# Patient Record
Sex: Male | Born: 1975 | Race: White | Hispanic: No | Marital: Married
Health system: Southern US, Community
[De-identification: ages and names within clinical notes are randomized; demographics above are authoritative.]

---

## 2010-09-16 ENCOUNTER — Emergency Department: Payer: Self-pay | Admitting: Emergency Medicine

## 2010-09-16 ENCOUNTER — Inpatient Hospital Stay: Payer: Self-pay | Admitting: Psychiatry

## 2010-09-27 ENCOUNTER — Inpatient Hospital Stay: Payer: Self-pay | Admitting: Unknown Physician Specialty

## 2010-12-19 ENCOUNTER — Emergency Department: Payer: Self-pay | Admitting: *Deleted

## 2013-08-15 ENCOUNTER — Ambulatory Visit: Payer: Self-pay

## 2013-08-18 ENCOUNTER — Ambulatory Visit: Payer: Self-pay | Admitting: Family Medicine

## 2013-12-02 ENCOUNTER — Ambulatory Visit: Payer: Self-pay | Admitting: Physician Assistant

## 2014-01-22 ENCOUNTER — Ambulatory Visit: Payer: Self-pay | Admitting: Physician Assistant

## 2014-02-17 ENCOUNTER — Ambulatory Visit: Payer: Self-pay

## 2014-06-20 ENCOUNTER — Ambulatory Visit: Payer: Self-pay | Admitting: Emergency Medicine

## 2014-07-15 ENCOUNTER — Ambulatory Visit: Payer: Self-pay | Admitting: Family Medicine

## 2015-08-30 ENCOUNTER — Emergency Department
Admission: EM | Admit: 2015-08-30 | Discharge: 2015-08-30 | Disposition: A | Discharge status: Home / Self Care | Payer: Self-pay | Attending: Emergency Medicine | Admitting: Emergency Medicine

## 2015-08-30 ENCOUNTER — Emergency Department: Payer: Self-pay

## 2015-08-30 DIAGNOSIS — E1165 Type 2 diabetes mellitus with hyperglycemia: Secondary | ICD-10-CM | POA: Insufficient documentation

## 2015-08-30 DIAGNOSIS — R739 Hyperglycemia, unspecified: Secondary | ICD-10-CM

## 2015-08-30 DIAGNOSIS — I10 Essential (primary) hypertension: Secondary | ICD-10-CM | POA: Insufficient documentation

## 2015-08-30 DIAGNOSIS — J45909 Unspecified asthma, uncomplicated: Secondary | ICD-10-CM | POA: Insufficient documentation

## 2015-08-30 DIAGNOSIS — J209 Acute bronchitis, unspecified: Secondary | ICD-10-CM

## 2015-08-30 LAB — URINALYSIS COMPLETE WITH MICROSCOPIC (ARMC ONLY)
Bacteria, UA: NONE SEEN
Bilirubin Urine: NEGATIVE
Hgb urine dipstick: NEGATIVE
Ketones, ur: NEGATIVE mg/dL
Nitrite: NEGATIVE
PROTEIN: NEGATIVE mg/dL
SPECIFIC GRAVITY, URINE: 1.032 — AB (ref 1.005–1.030)
pH: 5 (ref 5.0–8.0)

## 2015-08-30 LAB — CBC
HEMATOCRIT: 46.8 % (ref 40.0–52.0)
HEMOGLOBIN: 16 g/dL (ref 13.0–18.0)
MCH: 30.4 pg (ref 26.0–34.0)
MCHC: 34.3 g/dL (ref 32.0–36.0)
MCV: 88.7 fL (ref 80.0–100.0)
Platelets: 308 10*3/uL (ref 150–440)
RBC: 5.28 MIL/uL (ref 4.40–5.90)
RDW: 13.6 % (ref 11.5–14.5)
WBC: 13 10*3/uL — ABNORMAL HIGH (ref 3.8–10.6)

## 2015-08-30 LAB — BASIC METABOLIC PANEL
ANION GAP: 8 (ref 5–15)
BUN: 13 mg/dL (ref 6–20)
CHLORIDE: 99 mmol/L — AB (ref 101–111)
CO2: 26 mmol/L (ref 22–32)
Calcium: 9.4 mg/dL (ref 8.9–10.3)
Creatinine, Ser: 1.04 mg/dL (ref 0.61–1.24)
GFR calc non Af Amer: >60 mL/min (ref >60)
GLUCOSE: 435 mg/dL — AB (ref 65–99)
Potassium: 4 mmol/L (ref 3.5–5.1)
Sodium: 133 mmol/L — ABNORMAL LOW (ref 135–145)

## 2015-08-30 LAB — GLUCOSE, CAPILLARY
GLUCOSE-CAPILLARY: 402 mg/dL — AB (ref 65–99)
GLUCOSE-CAPILLARY: 380 mg/dL — AB (ref 65–99)

## 2015-08-30 MED ORDER — SODIUM CHLORIDE 0.9 % IV BOLUS (SEPSIS)
2000.0000 mL | Freq: Once | INTRAVENOUS | Status: AC
  Administered 2015-08-30: 2000 mL via INTRAVENOUS

## 2015-08-30 MED ORDER — AZITHROMYCIN 500 MG PO TABS
500.0000 mg | ORAL_TABLET | Freq: Once | ORAL | Status: AC
  Administered 2015-08-30: 500 mg via ORAL
  Filled 2015-08-30: qty 1

## 2015-08-30 MED ORDER — METFORMIN HCL 500 MG PO TABS: 500.0000 mg | ORAL_TABLET | Freq: Two times a day (BID) | ORAL | Status: AC

## 2015-08-30 MED ORDER — ALBUTEROL SULFATE (2.5 MG/3ML) 0.083% IN NEBU
5.0000 mg | INHALATION_SOLUTION | Freq: Once | RESPIRATORY_TRACT | Status: AC
Start: 2015-08-30 — End: 2015-08-30
  Administered 2015-08-30: 5 mg via RESPIRATORY_TRACT
  Filled 2015-08-30: qty 6

## 2015-08-30 MED ORDER — AZITHROMYCIN 250 MG PO TABS: ORAL_TABLET | ORAL | Status: AC

## 2015-08-30 MED ORDER — LISINOPRIL-HYDROCHLOROTHIAZIDE 20-12.5 MG PO TABS: 1.0000 | ORAL_TABLET | Freq: Every day | ORAL | Status: AC

## 2015-08-30 MED ORDER — IPRATROPIUM-ALBUTEROL 0.5-2.5 (3) MG/3ML IN SOLN
3.0000 mL | Freq: Once | RESPIRATORY_TRACT | Status: AC
  Administered 2015-08-30: 3 mL via RESPIRATORY_TRACT
  Filled 2015-08-30: qty 3

## 2015-08-30 NOTE — Discharge Instructions (Signed)
Acute Bronchitis Bronchitis is inflammation of the airways that extend from the windpipe into the lungs (bronchi). The inflammation often causes mucus to develop. This leads to a cough, which is the most common symptom of bronchitis.  In acute bronchitis, the condition usually develops suddenly and goes away over time, usually in a couple weeks. Smoking, allergies, and asthma can make bronchitis worse. Repeated episodes of bronchitis may cause further lung problems.  CAUSES Acute bronchitis is most often caused by the same virus that causes a cold. The virus can spread from person to person (contagious) through coughing, sneezing, and touching contaminated objects. SIGNS AND SYMPTOMS   Cough.   Fever.   Coughing up mucus.   Body aches.   Chest congestion.   Chills.   Shortness of breath.   Sore throat.  DIAGNOSIS  Acute bronchitis is usually diagnosed through a physical exam. Your health care provider will also ask you questions about your medical history. Tests, such as chest X-rays, are sometimes done to rule out other conditions.  TREATMENT  Acute bronchitis usually goes away in a couple weeks. Oftentimes, no medical treatment is necessary. Medicines are sometimes given for relief of fever or cough. Antibiotic medicines are usually not needed but may be prescribed in certain situations. In some cases, an inhaler may be recommended to help reduce shortness of breath and control the cough. A cool mist vaporizer may also be used to help thin bronchial secretions and make it easier to clear the chest.  HOME CARE INSTRUCTIONS  Get plenty of rest.   Drink enough fluids to keep your urine clear or pale yellow (unless you have a medical condition that requires fluid restriction). Increasing fluids may help thin your respiratory secretions (sputum) and reduce chest congestion, and it will prevent dehydration.   Take medicines only as directed by your health care provider.  If  you were prescribed an antibiotic medicine, finish it all even if you start to feel better.  Avoid smoking and secondhand smoke. Exposure to cigarette smoke or irritating chemicals will make bronchitis worse. If you are a smoker, consider using nicotine gum or skin patches to help control withdrawal symptoms. Quitting smoking will help your lungs heal faster.   Reduce the chances of another bout of acute bronchitis by washing your hands frequently, avoiding people with cold symptoms, and trying not to touch your hands to your mouth, nose, or eyes.   Keep all follow-up visits as directed by your health care provider.  SEEK MEDICAL CARE IF: Your symptoms do not improve after 1 week of treatment.  SEEK IMMEDIATE MEDICAL CARE IF:  You develop an increased fever or chills.   You have chest pain.   You have severe shortness of breath.  You have bloody sputum.   You develop dehydration.  You faint or repeatedly feel like you are going to pass out.  You develop repeated vomiting.  You develop a severe headache. MAKE SURE YOU:   Understand these instructions.  Will watch your condition.  Will get help right away if you are not doing well or get worse.   This information is not intended to replace advice given to you by your health care provider. Make sure you discuss any questions you have with your health care provider.   Document Released: 06/16/2004 Document Revised: 05/30/2014 Document Reviewed: 10/30/2012 Elsevier Interactive Patient Education 2016 [employer]  Hyperglycemia Hyperglycemia occurs when the glucose (sugar) in your blood is too high. Hyperglycemia can happen for many  reasons, but it most often happens to people who do not know they have diabetes or are not managing their diabetes properly.  CAUSES  Whether you have diabetes or not, there are other causes of hyperglycemia. Hyperglycemia can occur when you have diabetes, but it can also occur in other  situations that you might not be as aware of, such as: Diabetes  If you have diabetes and are having problems controlling your blood glucose, hyperglycemia could occur because of some of the following reasons:  Not following your meal plan.  Not taking your diabetes medications or not taking it properly.  Exercising less or doing less activity than you normally do.  Being sick. Pre-diabetes  This cannot be ignored. Before people develop Type 2 diabetes, they almost always have "pre-diabetes." This is when your blood glucose levels are higher than normal, but not yet high enough to be diagnosed as diabetes. Research has shown that some long-term damage to the body, especially the heart and circulatory system, may already be occurring during pre-diabetes. If you take action to manage your blood glucose when you have pre-diabetes, you may delay or prevent Type 2 diabetes from developing. Stress  If you have diabetes, you may be "diet" controlled or on oral medications or insulin to control your diabetes. However, you may find that your blood glucose is higher than usual in the hospital whether you have diabetes or not. This is often referred to as "stress hyperglycemia." Stress can elevate your blood glucose. This happens because of hormones put out by the body during times of stress. If stress has been the cause of your high blood glucose, it can be followed regularly by your caregiver. That way he/she can make sure your hyperglycemia does not continue to get worse or progress to diabetes. Steroids  Steroids are medications that act on the infection fighting system (immune system) to block inflammation or infection. One side effect can be a rise in blood glucose. Most people can produce enough extra insulin to allow for this rise, but for those who cannot, steroids make blood glucose levels go even higher. It is not unusual for steroid treatments to "uncover" diabetes that is developing. It is not  always possible to determine if the hyperglycemia will go away after the steroids are stopped. A special blood test called an A1c is sometimes done to determine if your blood glucose was elevated before the steroids were started. SYMPTOMS  Thirsty.  Frequent urination.  Dry mouth.  Blurred vision.  Tired or fatigue.  Weakness.  Sleepy.  Tingling in feet or leg. DIAGNOSIS  Diagnosis is made by monitoring blood glucose in one or all of the following ways:  A1c test. This is a chemical found in your blood.  Fingerstick blood glucose monitoring.  Laboratory results. TREATMENT  First, knowing the cause of the hyperglycemia is important before the hyperglycemia can be treated. Treatment may include, but is not be limited to:  Education.  Change or adjustment in medications.  Change or adjustment in meal plan.  Treatment for an illness, infection, etc.  More frequent blood glucose monitoring.  Change in exercise plan.  Decreasing or stopping steroids.  Lifestyle changes. HOME CARE INSTRUCTIONS   Test your blood glucose as directed.  Exercise regularly. Your caregiver will give you instructions about exercise. Pre-diabetes or diabetes which comes on with stress is helped by exercising.  Eat wholesome, balanced meals. Eat often and at regular, fixed times. Your caregiver or nutritionist will give you a  meal plan to guide your sugar intake.  Being at an ideal weight is important. If needed, losing as little as 10 to 15 pounds may help improve blood glucose levels. SEEK MEDICAL CARE IF:   You have questions about medicine, activity, or diet.  You continue to have symptoms (problems such as increased thirst, urination, or weight gain). SEEK IMMEDIATE MEDICAL CARE IF:   You are vomiting or have diarrhea.  Your breath smells fruity.  You are breathing faster or slower.  You are very sleepy or incoherent.  You have numbness, tingling, or pain in your feet or  hands.  You have chest pain.  Your symptoms get worse even though you have been following your caregiver's orders.  If you have any other questions or concerns.   This information is not intended to replace advice given to you by your health care provider. Make sure you discuss any questions you have with your health care provider.   Document Released: 11/02/2000 Document Revised: 08/01/2011 Document Reviewed: 01/13/2015 Elsevier Interactive Patient Education [employer]

## 2015-08-30 NOTE — ED Notes (Signed)
Pt to triage ambulatory with no difficulty. Pt reports he has been out of his metformin for about month. Pt report he thinks he may have passed out or had a seizure last night. Pt reports last thing he remembers was getting up from to couch to go to the kitchen then woke up in the floor this morning. Pt has a scratch on his forehead that was not there yesterday and he is having pain in neck on the right side. Pt states he did have a few beers last night but not enough to make him pass out.  Pt reports he cannot afford his medication because he does not work or have insurance. Pt given information about Medication Management for obtaining his medications during triage.

## 2015-08-30 NOTE — ED Provider Notes (Signed)
[facility] Emergency Department Provider Note  ____________________________________________  Time seen: 8:50 PM  I have reviewed the triage vital signs and the nursing notes.   HISTORY  Chief Complaint Hyperglycemia and Loss of Consciousness    HPI Charles Howard is a 40 y.o. male who reports that he may have passed out last night. He's been out of his metformin for diabetes for month and expressing polyuria and polydipsia over the past week. Complains of some pain in the right side of his neck but none in the middle. No numbness tingling or weakness. Also having worsened shortness of breath and a nonproductive cough over the past several days. Has a history of asthma.     No past medical history on file. Asthma Hypertension Diabetes  There are no active problems to display for this patient.    No past surgical history on file. Not  Current Outpatient Rx  Name  Route  Sig  Dispense  Refill  . azithromycin (ZITHROMAX Z-PAK) 250 MG tablet      Take 2 tablets (500 mg) on  Day 1,  followed by 1 tablet (250 mg) once daily on Days 2 through 5.   6 each   0   HCTZ Lisinopril Metformin   Allergies Metoprolol   No family history on file.  Social History Social History  Substance Use Topics  . Smoking status: Not on file  . Smokeless tobacco: Not on file  . Alcohol Use: Not on file  No tobacco use. Occasional alcohol.  Review of Systems  Constitutional:   No fever or chills.  Eyes:   No vision changes.  ENT:   No sore throat. No rhinorrhea. Cardiovascular:   No chest pain. Respiratory:   Positive dyspnea and nonproductive cough. Gastrointestinal:   Negative for abdominal pain, vomiting and diarrhea.  No bloody stool. Genitourinary:   Occasional dysuria. Positive urinary frequency and polyuria Musculoskeletal:   Negative for focal pain or swelling Neurological:   Negative for headaches 10-point ROS otherwise  negative.  ____________________________________________   PHYSICAL EXAM:  VITAL SIGNS: ED Triage Vitals  Enc Vitals Group     BP 08/30/15 2005 159/95 mmHg     Pulse Rate 08/30/15 2005 109     Resp 08/30/15 2005 18     Temp 08/30/15 2005 98.2 F (36.8 C)     Temp Source 08/30/15 2005 Oral     SpO2 08/30/15 2005 95 %     Weight 08/30/15 2005 264 lb (119.75 kg)     Height 08/30/15 2005  (1.88 m)     Head Cir --      Peak Flow --      Pain Score 08/30/15 2018 8     Pain Loc --      Pain Edu? --      Excl. in GC? --     Vital signs reviewed, nursing assessments reviewed.   Constitutional:   Alert and oriented. Well appearing and in no distress. Eyes:   No scleral icterus. No conjunctival pallor. PERRL. EOMI ENT   Head:   Normocephalic and atraumatic.   Nose:   No congestion/rhinnorhea. No septal hematoma   Mouth/Throat:   MMM, no pharyngeal erythema. No peritonsillar mass.    Neck:   No stridor. No SubQ emphysema. No meningismus. Hematological/Lymphatic/Immunilogical:   No cervical lymphadenopathy. Cardiovascular:   RRR. Symmetric bilateral radial and DP pulses.  No murmurs.  Respiratory:   Normal respiratory effort without tachypnea nor retractions.  Mild expiratory wheezing diffusely. No focal crackles. Gastrointestinal:   Soft and nontender. Non distended. There is no CVA tenderness.  No rebound, rigidity, or guarding. Genitourinary:   deferred Musculoskeletal:   Nontender with normal range of motion in all extremities. No joint effusions.  No lower extremity tenderness.  No edema. Neurologic:   Normal speech and language.  CN 2-10 normal. Motor grossly intact. No gross focal neurologic deficits are appreciated.  Skin:    Skin is warm, dry and intact. No rash noted.  No petechiae, purpura, or bullae.  ____________________________________________    LABS (pertinent positives/negatives) (all labs ordered are listed, but only abnormal results are  displayed) Labs Reviewed  GLUCOSE, CAPILLARY - Abnormal; Notable for the following:    Glucose-Capillary 402 (*)    All other components within normal limits  BASIC METABOLIC PANEL - Abnormal; Notable for the following:    Sodium 133 (*)    Chloride 99 (*)    Glucose, Bld 435 (*)    All other components within normal limits  CBC - Abnormal; Notable for the following:    WBC 13.0 (*)    All other components within normal limits  URINALYSIS COMPLETEWITH MICROSCOPIC ([facility] ONLY) - Abnormal; Notable for the following:    Color, Urine YELLOW (*)    APPearance CLEAR (*)    Glucose, UA >500 (*)    Specific Gravity, Urine 1.032 (*)    Leukocytes, UA TRACE (*)    Squamous Epithelial / LPF 0-5 (*)    All other components within normal limits  CBG MONITORING, ED   ____________________________________________   EKG  Interpreted by me Sinus tachycardia rate 101, normal axis and intervals. Normal QRS and ST segments. Normal T waves.  ____________________________________________    RADIOLOGY  Chest x-ray unremarkable  ____________________________________________   PROCEDURES   ____________________________________________   INITIAL IMPRESSION / ASSESSMENT AND PLAN / ED COURSE  Pertinent labs & imaging results that were available during my care of the patient were reviewed by me and considered in my medical decision making (see chart for details).  Patient presents with hyperglycemia in the setting of medication noncompliance. He reports this is due to affordability issues. Appears to be mildly dehydrated with a tachycardia of 110. Other vital signs are unremarkable. Lab workup is essentially unremarkable. No evidence of acidosis. Urinary tract infection. No pneumonia. He appears to have an acute bronchitis. I'll start him on azithromycin especially because of his uncontrolled diabetes and concerned that he may be developing an atypical infection. He is given information for  medication management clinic and will follow-up to obtain his hypertension and diabetes medications for which she has prescriptions already.     ____________________________________________   FINAL CLINICAL IMPRESSION(S) / ED DIAGNOSES  Final diagnoses:  Acute bronchitis, unspecified organism  Hyperglycemia       Portions of this note were generated with dragon dictation software. Dictation errors may occur despite best attempts at proofreading.   Sharman CheekPhillip Marjorie Deprey, MD 08/30/15 [phone]

## 2016-01-30 IMAGING — CT CT CERVICAL SPINE WITHOUT CONTRAST
3 series · 16 of 33 positions shown, 19 images · non-contrast
Comparison: Head CT 08/18/2013

CLINICAL DATA: Fall and injured neck.

EXAM:
CT CERVICAL SPINE WITHOUT CONTRAST
TECHNIQUE: Multidetector CT imaging of the cervical spine was performed without
intravenous contrast. Multiplanar CT image reconstructions were also
generated.

[Series 3: c spine soft · axial · 0.31mm/px · z∈[-201,-31]mm · 8 of 101 slices shown, 10 images]
[im 8/101  soft-tissue]
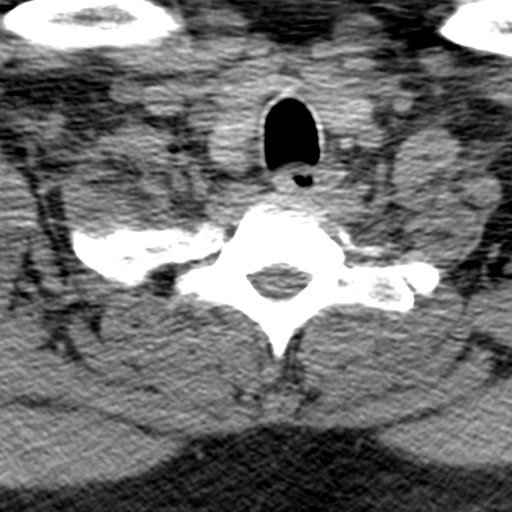
[im 8/101  bone]
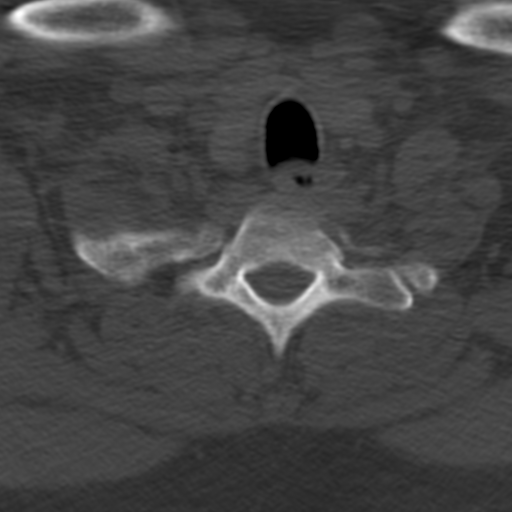
[im 24/101  bone]
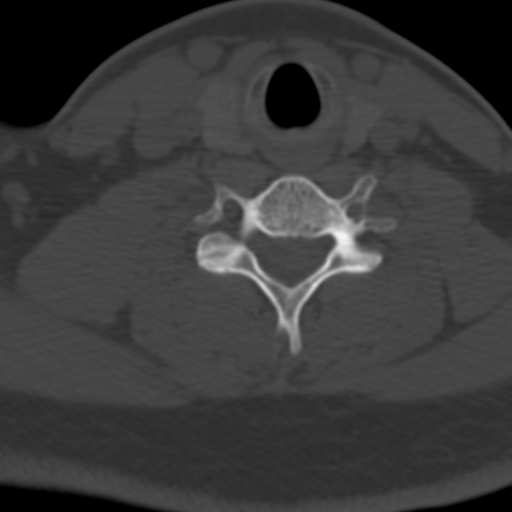
[im 31/101  bone]
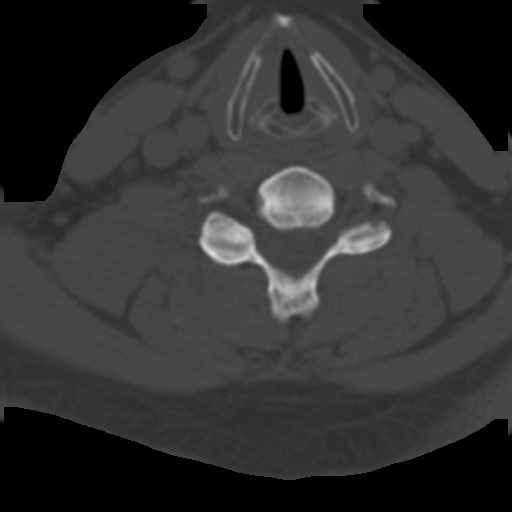
[im 47/101  bone]
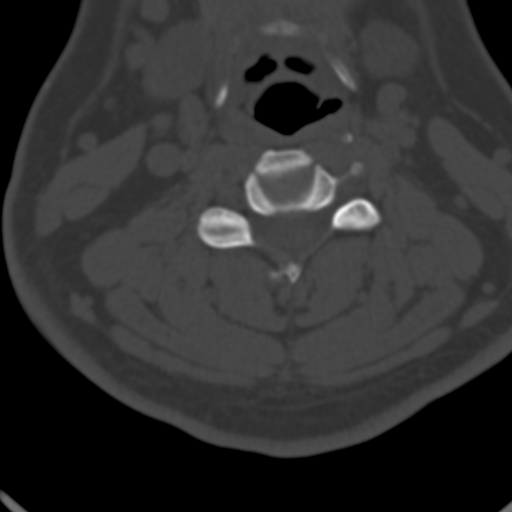
[im 54/101  soft-tissue]
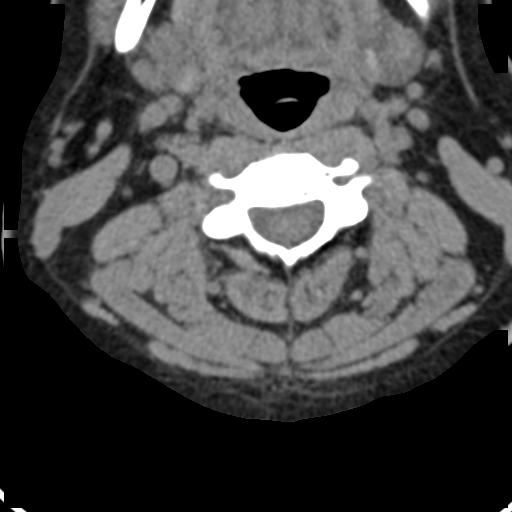
[im 54/101  bone]
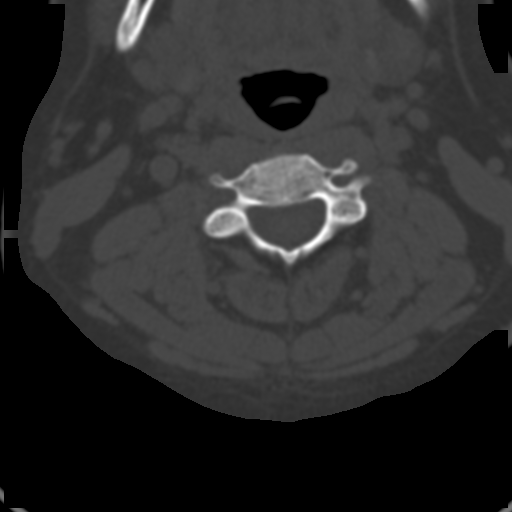
[im 70/101  bone]
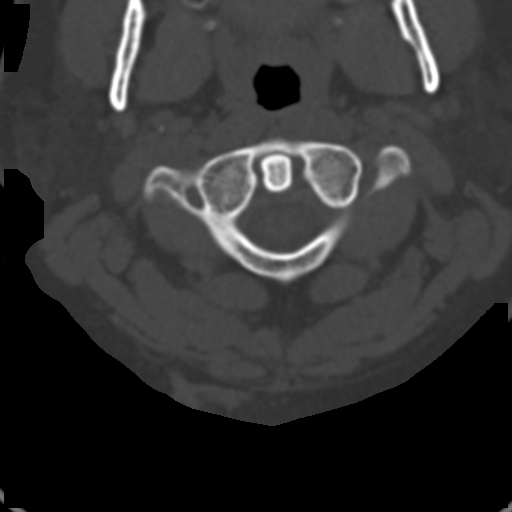
[im 77/101  bone]
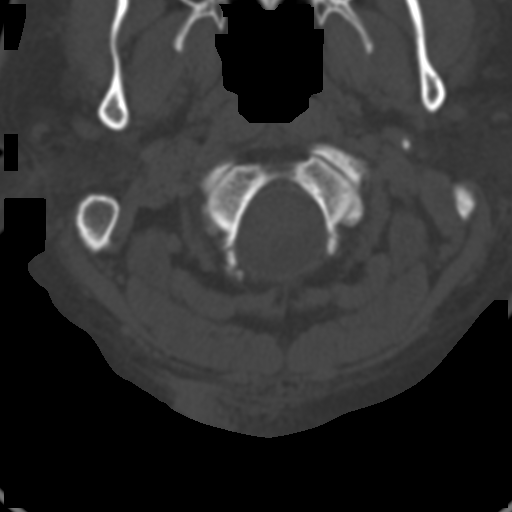
[im 93/101  bone]
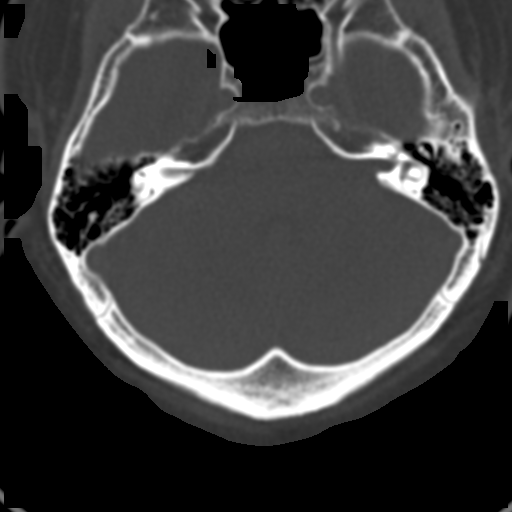

[Series 602: coronal bone · coronal · 0.39mm/px · 3 of 65 slices shown]
[im 13/65  bone]
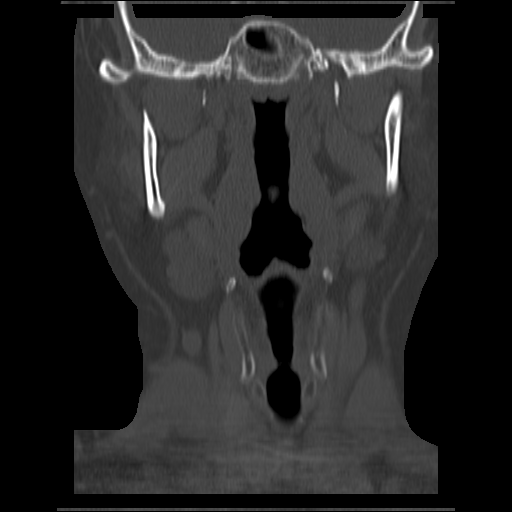
[im 26/65  bone]
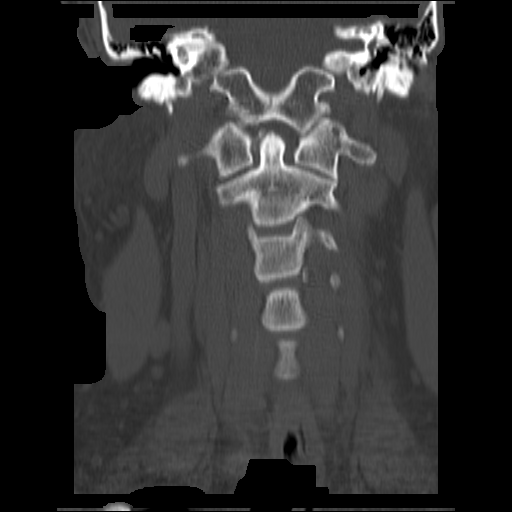
[im 39/65  bone]
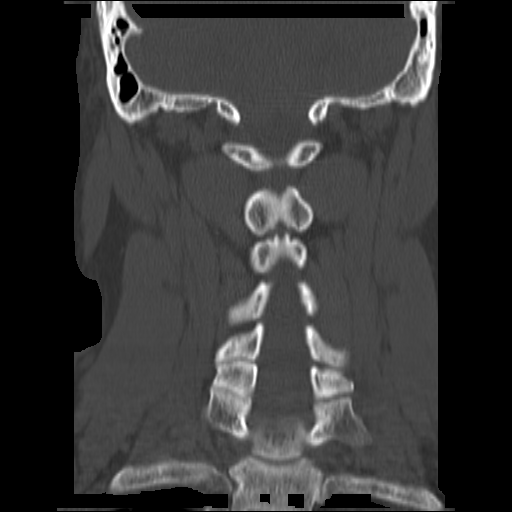

[Series 603: sagittal bone · sagittal · 0.39mm/px · 5 of 56 slices shown, 6 images]
[im 19/56  bone]
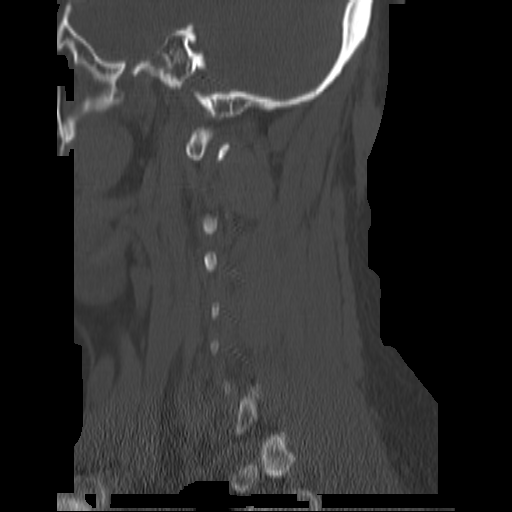
[im 23/56  bone]
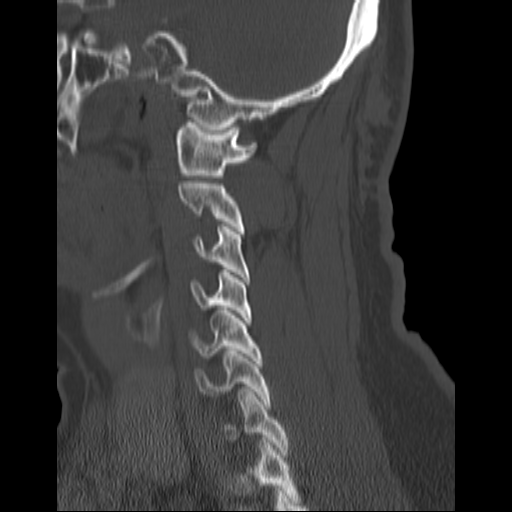
[im 28/56  soft-tissue]
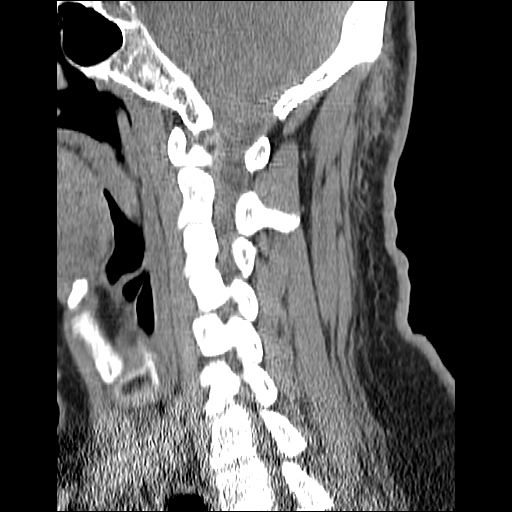
[im 28/56  bone]
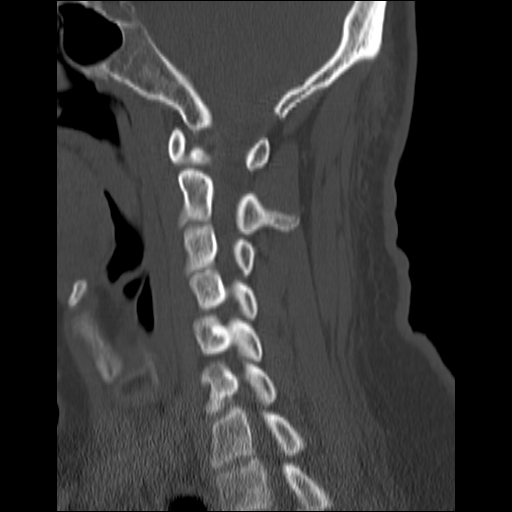
[im 33/56  bone]
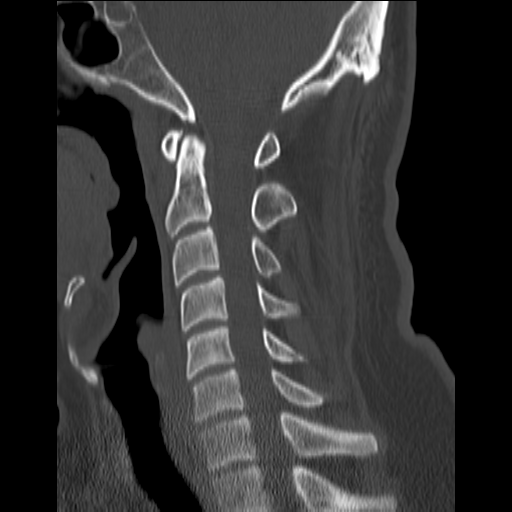
[im 37/56  bone]
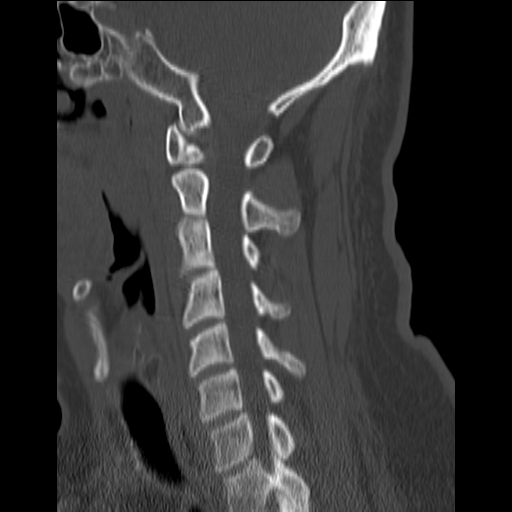

[16 of 33 positions shown; findings below may reference images not displayed]

FINDINGS: Limited evaluation of the lung apices. Negative for a fracture or
dislocation. There is soft tissue edema along the posterior upper
neck along the right side. Findings suggest a small subcutaneous
hematoma. There is no paravertebral soft tissue swelling. No
significant neck lymphadenopathy. Symmetric appearance of the
submandibular glands. Normal alignment of the cervical spine.
IMPRESSION: No acute bone abnormality to the cervical spine.

Soft tissue swelling in the posterior upper neck subcutaneous
tissues. Findings may represent a small superficial hematoma. No
underlying fracture.

## 2016-02-10 ENCOUNTER — Emergency Department
Admission: EM | Admit: 2016-02-10 | Discharge: 2016-02-10 | Disposition: A | Discharge status: Home / Self Care | Payer: Self-pay | Attending: Emergency Medicine | Admitting: Emergency Medicine

## 2016-02-10 DIAGNOSIS — Z760 Encounter for issue of repeat prescription: Secondary | ICD-10-CM | POA: Insufficient documentation

## 2016-02-10 DIAGNOSIS — Z7984 Long term (current) use of oral hypoglycemic drugs: Secondary | ICD-10-CM | POA: Insufficient documentation

## 2016-02-10 DIAGNOSIS — Z79899 Other long term (current) drug therapy: Secondary | ICD-10-CM | POA: Insufficient documentation

## 2016-02-10 DIAGNOSIS — J45901 Unspecified asthma with (acute) exacerbation: Secondary | ICD-10-CM | POA: Insufficient documentation

## 2016-02-10 MED ORDER — ALBUTEROL SULFATE HFA 108 (90 BASE) MCG/ACT IN AERS: 2.0000 | INHALATION_SPRAY | Freq: Four times a day (QID) | RESPIRATORY_TRACT | 2 refills | Status: AC | PRN

## 2016-02-10 MED ORDER — IPRATROPIUM-ALBUTEROL 0.5-2.5 (3) MG/3ML IN SOLN
3.0000 mL | Freq: Once | RESPIRATORY_TRACT | Status: AC
  Administered 2016-02-10: 3 mL via RESPIRATORY_TRACT
  Filled 2016-02-10: qty 3

## 2016-02-10 NOTE — ED Triage Notes (Signed)
Pt in with [facility] since Sunday hx of sports induced asthma and has been around diesel fuel recently. States ran our of inhalers no distress noted at this time.

## 2016-02-10 NOTE — ED Provider Notes (Signed)
[facility] Emergency Department Provider Note   ____________________________________________   None    (approximate)  I have reviewed the triage vital signs and the nursing notes.   HISTORY  Chief Complaint Shortness of Breath    HPI Charles Howard is a 40 y.o. male patient complain intermitting shortness of breath and wheezing for 3 days. Patient state has a history of some [place] and his asthma but is recently been working around diesel feels which is exacerbated his asthma. Patient state he is out of his inhaler. Patient denies any other URI signs symptoms.    No past medical history on file.  There are no active problems to display for this patient.   No past surgical history on file.  Prior to Admission medications   Medication Sig Start Date End Date Taking? Authorizing Provider  azithromycin (ZITHROMAX Z-PAK) 250 MG tablet Take 2 tablets (500 mg) on  Day 1,  followed by 1 tablet (250 mg) once daily on Days 2 through 5. 08/30/15   Sharman Cheek, MD  lisinopril-hydrochlorothiazide (ZESTORETIC) 20-12.5 MG tablet Take 1 tablet by mouth daily. 08/30/15 08/29/16  Sharman Cheek, MD  metFORMIN (GLUCOPHAGE) 500 MG tablet Take 1 tablet (500 mg total) by mouth 2 (two) times daily with a meal. 08/30/15   Sharman Cheek, MD    Allergies Metoprolol  No family history on file.  Social History Social History  Substance Use Topics  . Smoking status: Not on file  . Smokeless tobacco: Not on file  . Alcohol use Not on file    Review of Systems Constitutional: No fever/chills Eyes: No visual changes. ENT: No sore throat. Cardiovascular: Denies chest pain. Respiratory: Intermitting dyspnea and wheezing. Gastrointestinal: No abdominal pain.  No nausea, no vomiting.  No diarrhea.  No constipation. Genitourinary: Negative for dysuria. Musculoskeletal: Negative for back pain. Skin: Negative for rash. Neurological: Negative for headaches,  focal weakness or numbness.   ____________________________________________   PHYSICAL EXAM:  VITAL SIGNS: ED Triage Vitals  Enc Vitals Group     BP 02/10/16 2242 (!) 143/87     Pulse Rate 02/10/16 2241 (!) 108     Resp 02/10/16 2241 18     Temp 02/10/16 2241 98.5 F (36.9 C)     Temp Source 02/10/16 2241 Oral     SpO2 02/10/16 2241 97 %     Weight 02/10/16 2242 250 lb (113.4 kg)     Height 02/10/16 2242 6\' 2"  (1.88 m)     Head Circumference --      Peak Flow --      Pain Score 02/10/16 2242 8     Pain Loc --      Pain Edu? --      Excl. in GC? --     Constitutional: Alert and oriented. Well appearing and in no acute distress. Eyes: Conjunctivae are normal. PERRL. EOMI. Head: Atraumatic. Nose: No congestion/rhinnorhea. Mouth/Throat: Mucous membranes are moist.  Oropharynx non-erythematous. Neck: No stridor.  No cervical spine tenderness to palpation. Hematological/Lymphatic/Immunilogical: No cervical lymphadenopathy. Cardiovascular: Normal rate, regular rhythm. Grossly normal heart sounds.  Good peripheral circulation. Respiratory: Normal respiratory effort.  No retractions. Lungs Expiratory wheezing Gastrointestinal: Soft and nontender. No distention. No abdominal bruits. No CVA tenderness. Musculoskeletal: No lower extremity tenderness nor edema.  No joint effusions. Neurologic:  Normal speech and language. No gross focal neurologic deficits are appreciated. No gait instability. Skin:  Skin is warm, dry and intact. No rash noted. Psychiatric: Mood and affect  are normal. Speech and behavior are normal.  ____________________________________________   LABS (all labs ordered are listed, but only abnormal results are displayed)  Labs Reviewed - No data to display ____________________________________________  EKG   ____________________________________________  RADIOLOGY   ____________________________________________   PROCEDURES  Procedure(s) performed:  None  Procedures  Critical Care performed: No  ____________________________________________   INITIAL IMPRESSION / ASSESSMENT AND PLAN / ED COURSE  Pertinent labs & imaging results that were available during my care of the patient were reviewed by me and considered in my medical decision making (see chart for details).  Reactive airway. Patient given discharge Instructions. Patient given a prescription for Proventil inhaler. [facility]  Clinical Course   She lungs clear to auscultation status post 12 Treatment.  ____________________________________________   FINAL CLINICAL IMPRESSION(S) / ED DIAGNOSES  Final diagnoses:  Asthma exacerbation  Encounter for medication refill      NEW MEDICATIONS STARTED DURING THIS VISIT:  New Prescriptions   No medications on file     Note:  This document was prepared using Dragon voice recognition software and may include unintentional dictation errors.    Joni ReiningRonald K Eldor Conaway, PA-C 02/10/16 2302    Nita Sicklearolina Veronese, MD 02/10/16 [phone]

## 2016-03-22 ENCOUNTER — Emergency Department
Admission: EM | Admit: 2016-03-22 | Discharge: 2016-03-22 | Disposition: A | Discharge status: Home / Self Care | Payer: Self-pay | Attending: Emergency Medicine | Admitting: Emergency Medicine

## 2016-03-22 DIAGNOSIS — F1010 Alcohol abuse, uncomplicated: Secondary | ICD-10-CM | POA: Insufficient documentation

## 2016-03-22 DIAGNOSIS — Z79899 Other long term (current) drug therapy: Secondary | ICD-10-CM | POA: Insufficient documentation

## 2016-03-22 DIAGNOSIS — R0789 Other chest pain: Secondary | ICD-10-CM | POA: Insufficient documentation

## 2016-03-22 DIAGNOSIS — R519 Headache, unspecified: Secondary | ICD-10-CM

## 2016-03-22 DIAGNOSIS — Z7984 Long term (current) use of oral hypoglycemic drugs: Secondary | ICD-10-CM | POA: Insufficient documentation

## 2016-03-22 DIAGNOSIS — Z792 Long term (current) use of antibiotics: Secondary | ICD-10-CM | POA: Insufficient documentation

## 2016-03-22 DIAGNOSIS — R51 Headache: Secondary | ICD-10-CM | POA: Insufficient documentation

## 2016-03-22 DIAGNOSIS — R739 Hyperglycemia, unspecified: Secondary | ICD-10-CM | POA: Insufficient documentation

## 2016-03-22 DIAGNOSIS — F1490 Cocaine use, unspecified, uncomplicated: Secondary | ICD-10-CM | POA: Insufficient documentation

## 2016-03-22 LAB — GLUCOSE, CAPILLARY
Glucose-Capillary: 258 mg/dL — ABNORMAL HIGH (ref 65–99)
Glucose-Capillary: 180 mg/dL — ABNORMAL HIGH (ref 65–99)

## 2016-03-22 MED ORDER — BUTALBITAL-APAP-CAFFEINE 50-325-40 MG PO TABS
1.0000 | ORAL_TABLET | Freq: Once | ORAL | Status: AC
  Administered 2016-03-22: 1 via ORAL
  Filled 2016-03-22: qty 1

## 2016-03-22 MED ORDER — GLIPIZIDE 5 MG PO TABS: 5.0000 mg | ORAL_TABLET | Freq: Two times a day (BID) | ORAL | 1 refills | Status: AC

## 2016-03-22 MED ORDER — SERTRALINE HCL 50 MG PO TABS: 50.0000 mg | ORAL_TABLET | Freq: Every day | ORAL | 1 refills | Status: AC

## 2016-03-22 MED ORDER — SODIUM CHLORIDE 0.9 % IV BOLUS (SEPSIS)
1000.0000 mL | Freq: Once | INTRAVENOUS | Status: AC
  Administered 2016-03-22: 1000 mL via INTRAVENOUS

## 2016-03-22 NOTE — ED Provider Notes (Signed)
[facility] Emergency Department Provider Note  _______________________________________   I have reviewed the triage vital signs and the nursing notes.   HISTORY  Chief Complaint Multiple complaints  History limited by: Not Limited   HPI Charles Howard is a 40 y.o. male who presents to the emergency department today because of multiple concerns.  One concern is for his alcohol and drug use. He states that he has been drinking since 10 am. Has a history of chronic alcohol abuse. States he has done detox in the hospital before. Another concern is that he is out of his depression and anxiety medication. The patient does not currently have an outpatient physician. States that he is out of his medication and can tell he is feeling worse.  Furthermore the patient is complaining of a headache. States it has been going on for 2 months. States that the alcohol will make it feel better.   No past medical history on file.  There are no active problems to display for this patient.   No past surgical history on file.  Prior to Admission medications   Medication Sig Start Date End Date Taking? Authorizing Provider  albuterol (PROVENTIL HFA;VENTOLIN HFA) 108 (90 Base) MCG/ACT inhaler Inhale 2 puffs into the lungs every 6 (six) hours as needed for wheezing or shortness of breath. 02/10/16   Joni Reiningonald K Smith, PA-C  azithromycin (ZITHROMAX Z-PAK) 250 MG tablet Take 2 tablets (500 mg) on  Day 1,  followed by 1 tablet (250 mg) once daily on Days 2 through 5. 08/30/15   Sharman CheekPhillip Stafford, MD  lisinopril-hydrochlorothiazide (ZESTORETIC) 20-12.5 MG tablet Take 1 tablet by mouth daily. 08/30/15 08/29/16  Sharman CheekPhillip Stafford, MD  metFORMIN (GLUCOPHAGE) 500 MG tablet Take 1 tablet (500 mg total) by mouth 2 (two) times daily with a meal. 08/30/15   Sharman CheekPhillip Stafford, MD    Allergies Metoprolol  No family history on file.  Social History Positive alcohol use Positive  cocaine  Review of Systems  Constitutional: Negative for fever. Cardiovascular: Positive for chest pressure. Respiratory: Negative for shortness of breath. Gastrointestinal: Negative for abdominal pain, vomiting and diarrhea. Genitourinary: Negative for dysuria. Musculoskeletal: Negative for back pain. Skin: Negative for rash. Neurological: Positive for headache.  10-point ROS otherwise negative.  ____________________________________________   PHYSICAL EXAM:  VITAL SIGNS: ED Triage Vitals  Enc Vitals Group     BP 03/22/16 1453 (!) 139/101     Pulse Rate 03/22/16 1453 (!) 110     Resp 03/22/16 1453 16     Temp 03/22/16 1453 97.6 F (36.4 C)     Temp Source 03/22/16 1453 Oral     SpO2 03/22/16 1453 96 %     Weight 03/22/16 1454 250 lb (113.4 kg)     Height 03/22/16 1454 6\' 2"  (1.88 m)   Constitutional: Alert and oriented. Well appearing and in no distress. Eyes: Conjunctivae are normal. Normal extraocular movements. ENT   Head: Normocephalic and atraumatic.   Nose: No congestion/rhinnorhea.   Mouth/Throat: Mucous membranes are moist.   Neck: No stridor. Hematological/Lymphatic/Immunilogical: No cervical lymphadenopathy. Cardiovascular: Normal rate, regular rhythm.  No murmurs, rubs, or gallops.  Respiratory: Normal respiratory effort without tachypnea nor retractions. Breath sounds are clear and equal bilaterally. No wheezes/rales/rhonchi. Gastrointestinal: Soft and nontender. No distention.  Genitourinary: Deferred Musculoskeletal: Normal range of motion in all extremities. No lower extremity edema. Neurologic:  Normal speech and language. No gross focal neurologic deficits are appreciated.  Skin:  Skin is warm, dry and  intact. No rash noted. Psychiatric: Mood and affect are normal. Speech and behavior are normal. Patient exhibits appropriate insight and judgment.  ____________________________________________    LABS (pertinent  positives/negatives)  Labs Reviewed  GLUCOSE, CAPILLARY - Abnormal; Notable for the following:       Result Value   Glucose-Capillary 258 (*)    All other components within normal limits  GLUCOSE, CAPILLARY - Abnormal; Notable for the following:    Glucose-Capillary 180 (*)    All other components within normal limits  CBG MONITORING, ED     ____________________________________________   EKG  I, Phineas SemenGraydon Zollie Ellery, attending physician, personally viewed and interpreted this EKG  EKG Time: 1535 Rate: 104 Rhythm: sinus tachycardia Axis: normal Intervals: qtc 428 QRS: narrow ST changes: no st elevation Impression: abnormal ekg   ____________________________________________    RADIOLOGY  None  ____________________________________________   PROCEDURES  Procedures  ____________________________________________   INITIAL IMPRESSION / ASSESSMENT AND PLAN / ED COURSE  Pertinent labs & imaging results that were available during my care of the patient were reviewed by me and considered in my medical decision making (see chart for details).  Patient with multiple complaints. Initial sugar was elevated however that did respond appropriately to fluids. Will plan on giving patient both outpatient primary care information as well as RTS information. Will refill depression and diabetes medication.  ____________________________________________   FINAL CLINICAL IMPRESSION(S) / ED DIAGNOSES  Final diagnoses:  Hyperglycemia  Alcohol abuse  Nonintractable headache, unspecified chronicity pattern, unspecified headache type     Note: This dictation was prepared with Dragon dictation. Any transcriptional errors that result from this process are unintentional    Phineas SemenGraydon Dimitri Dsouza, MD 03/22/16 1825

## 2016-03-22 NOTE — ED Notes (Signed)
AAOx3. Skin warm and dry.  NAD.  Remains calm and cooperative.  Continue to monitor.

## 2016-03-22 NOTE — ED Triage Notes (Signed)
Pt brought in by PD.  Pt was at a scene where the PD had to come.  He was not arrested but ask police to bring him in for drug and alcohol rehab treatment.  Pt states he has been drinking since 10am.  Reports drinking beer.  Pt reports using "crack coccaine" as "much as he can".  Last use of crack coccaine was 9 pm last night.  Pt is currently calm and cooperative.  He denies SI/HI.

## 2016-03-22 NOTE — ED Triage Notes (Signed)
Brought in via [facility] for [facility] health eval

## 2016-03-22 NOTE — Discharge Instructions (Signed)
Please seek medical attention for any high fevers, chest pain, shortness of breath, change in behavior, persistent vomiting, bloody stool or any other new or concerning symptoms.  

## 2016-03-22 NOTE — ED Notes (Signed)
AAOx3.  Skin warm and dry. NAD.  Ambulates with easy and steady gait. NAD °
